# Patient Record
Sex: Male | Born: 1955 | Race: White | Hispanic: No | Marital: Single | Smoking: Current every day smoker
Health system: Southern US, Community
[De-identification: ages and names within clinical notes are randomized; demographics above are authoritative.]

## PROBLEM LIST (undated history)

## (undated) HISTORY — PX: EYE SURGERY: SHX253

## (undated) HISTORY — PX: HERNIA REPAIR: SHX51

---

## 2014-01-09 ENCOUNTER — Emergency Department (HOSPITAL_COMMUNITY): Payer: Self-pay

## 2014-01-09 ENCOUNTER — Encounter (HOSPITAL_COMMUNITY): Payer: Self-pay | Admitting: Emergency Medicine

## 2014-01-09 ENCOUNTER — Emergency Department (HOSPITAL_COMMUNITY)
Admission: EM | Admit: 2014-01-09 | Discharge: 2014-01-10 | Disposition: A | Discharge status: Home / Self Care | Payer: Self-pay | Attending: Emergency Medicine | Admitting: Emergency Medicine

## 2014-01-09 DIAGNOSIS — K222 Esophageal obstruction: Secondary | ICD-10-CM | POA: Insufficient documentation

## 2014-01-09 DIAGNOSIS — T18128A Food in esophagus causing other injury, initial encounter: Secondary | ICD-10-CM | POA: Insufficient documentation

## 2014-01-09 DIAGNOSIS — T18108A Unspecified foreign body in esophagus causing other injury, initial encounter: Secondary | ICD-10-CM

## 2014-01-09 DIAGNOSIS — F17200 Nicotine dependence, unspecified, uncomplicated: Secondary | ICD-10-CM | POA: Insufficient documentation

## 2014-01-09 DIAGNOSIS — N289 Disorder of kidney and ureter, unspecified: Secondary | ICD-10-CM

## 2014-01-09 DIAGNOSIS — K2210 Ulcer of esophagus without bleeding: Secondary | ICD-10-CM | POA: Insufficient documentation

## 2014-01-09 DIAGNOSIS — E860 Dehydration: Secondary | ICD-10-CM

## 2014-01-09 DIAGNOSIS — Z7982 Long term (current) use of aspirin: Secondary | ICD-10-CM | POA: Insufficient documentation

## 2014-01-09 DIAGNOSIS — T189XXA Foreign body of alimentary tract, part unspecified, initial encounter: Secondary | ICD-10-CM

## 2014-01-09 DIAGNOSIS — R739 Hyperglycemia, unspecified: Secondary | ICD-10-CM

## 2014-01-09 NOTE — ED Notes (Signed)
Pt. reported that a piece of meat lodged in his lower esophagus while eating pot roast last Sunday , airway intact / respirations unlabored .

## 2014-01-10 ENCOUNTER — Encounter (HOSPITAL_COMMUNITY)
Admission: EM | Disposition: A | Discharge status: Home / Self Care | Payer: Self-pay | Source: Home / Self Care | Attending: Emergency Medicine

## 2014-01-10 ENCOUNTER — Encounter (HOSPITAL_COMMUNITY): Payer: Self-pay

## 2014-01-10 DIAGNOSIS — K222 Esophageal obstruction: Secondary | ICD-10-CM

## 2014-01-10 DIAGNOSIS — T18108A Unspecified foreign body in esophagus causing other injury, initial encounter: Secondary | ICD-10-CM

## 2014-01-10 HISTORY — PX: ESOPHAGOGASTRODUODENOSCOPY: SHX5428

## 2014-01-10 LAB — BASIC METABOLIC PANEL
ANION GAP: 16 — AB (ref 5–15)
BUN: 38 mg/dL — ABNORMAL HIGH (ref 6–23)
CALCIUM: 9.5 mg/dL (ref 8.4–10.5)
CO2: 21 mmol/L (ref 19–32)
CREATININE: 1.48 mg/dL — AB (ref 0.50–1.35)
Chloride: 107 mEq/L (ref 96–112)
GFR calc non Af Amer: 50 mL/min — ABNORMAL LOW (ref >90)
GFR, EST AFRICAN AMERICAN: 58 mL/min — AB (ref >90)
Glucose, Bld: 155 mg/dL — ABNORMAL HIGH (ref 70–99)
POTASSIUM: 4.5 mmol/L (ref 3.5–5.1)
Sodium: 144 mmol/L (ref 135–145)

## 2014-01-10 LAB — CBC WITH DIFFERENTIAL/PLATELET
BASOS ABS: 0 10*3/uL (ref 0.0–0.1)
Basophils Relative: 0 % (ref 0–1)
EOS PCT: 0 % (ref 0–5)
Eosinophils Absolute: 0 10*3/uL (ref 0.0–0.7)
HEMATOCRIT: 51 % (ref 39.0–52.0)
HEMOGLOBIN: 18.1 g/dL — AB (ref 13.0–17.0)
Lymphocytes Relative: 17 % (ref 12–46)
Lymphs Abs: 2.3 10*3/uL (ref 0.7–4.0)
MCH: 33 pg (ref 26.0–34.0)
MCHC: 35.5 g/dL (ref 30.0–36.0)
MCV: 92.9 fL (ref 78.0–100.0)
MONO ABS: 0.7 10*3/uL (ref 0.1–1.0)
MONOS PCT: 5 % (ref 3–12)
NEUTROS ABS: 10.9 10*3/uL — AB (ref 1.7–7.7)
Neutrophils Relative %: 78 % — ABNORMAL HIGH (ref 43–77)
Platelets: 270 10*3/uL (ref 150–400)
RBC: 5.49 MIL/uL (ref 4.22–5.81)
RDW: 13 % (ref 11.5–15.5)
WBC: 13.9 10*3/uL — AB (ref 4.0–10.5)

## 2014-01-10 SURGERY — EGD (ESOPHAGOGASTRODUODENOSCOPY)
Anesthesia: Moderate Sedation

## 2014-01-10 MED ORDER — BUTAMBEN-TETRACAINE-BENZOCAINE 2-2-14 % EX AERO
INHALATION_SPRAY | CUTANEOUS | Status: DC | PRN
  Administered 2014-01-10: 2 via TOPICAL

## 2014-01-10 MED ORDER — SODIUM CHLORIDE 0.9 % IV SOLN
1000.0000 mL | Freq: Once | INTRAVENOUS | Status: AC
  Administered 2014-01-10: 1000 mL via INTRAVENOUS

## 2014-01-10 MED ORDER — FENTANYL CITRATE 0.05 MG/ML IJ SOLN
INTRAMUSCULAR | Status: AC
  Filled 2014-01-10: qty 2

## 2014-01-10 MED ORDER — MIDAZOLAM HCL 5 MG/ML IJ SOLN
INTRAMUSCULAR | Status: AC
  Filled 2014-01-10: qty 2

## 2014-01-10 MED ORDER — SODIUM CHLORIDE 0.9 % IV SOLN
1000.0000 mL | INTRAVENOUS | Status: DC
  Administered 2014-01-10: 1000 mL via INTRAVENOUS

## 2014-01-10 MED ORDER — GLUCAGON HCL RDNA (DIAGNOSTIC) 1 MG IJ SOLR
1.0000 mg | Freq: Once | INTRAMUSCULAR | Status: AC
  Administered 2014-01-10: 1 mg via INTRAVENOUS
  Filled 2014-01-10: qty 1

## 2014-01-10 MED ORDER — FENTANYL CITRATE 0.05 MG/ML IJ SOLN
INTRAMUSCULAR | Status: DC | PRN
  Administered 2014-01-10 (×3): 25 ug via INTRAVENOUS

## 2014-01-10 MED ORDER — PANTOPRAZOLE SODIUM 40 MG PO TBEC: 40.0000 mg | DELAYED_RELEASE_TABLET | Freq: Every day | ORAL | Status: AC

## 2014-01-10 MED ORDER — MIDAZOLAM HCL 5 MG/5ML IJ SOLN
INTRAMUSCULAR | Status: DC | PRN
  Administered 2014-01-10 (×3): 2 mg via INTRAVENOUS

## 2014-01-10 NOTE — ED Notes (Signed)
Pt sts that he feels like something is stuck in his throat.  Sts the sensation has progressed down into his abdomen and that it feels "tight".  Sts he has tried drinking water, warm water, diet coke with no relief; sts everything comes back up.  Airway intact.

## 2014-01-10 NOTE — ED Provider Notes (Signed)
CSN: [id number]     Arrival date & time 01/09/14  2324 History  This chart was scribed for Dione Boozeavid Benjie Ricketson, MD by Richarda Overlieichard Holland, ED Scribe. This patient was seen in room D34C/D34C and the patient's care was started 1:13 AM.    Chief Complaint  Patient presents with  . Foreign Body   The history is provided by the patient. No language interpreter was used.   HPI Comments: Craig Crawford is a 59 y.o. male who presents to the Emergency Department complaining of a foreign body in his throat that started 3 days ago. Pt reports that a piece of meat lodged in his lower esophagus while he eating pot roast. He states that he feels like something is stuck in his throat and the sensation has since progressed down into his abdomen. Pt describes the sensation as "tight". He says he has tried drinking water, olive oil, coconut oil and diet coke and the olive oil did feel like it worked the foreign body down. Pt states that when he urinated today his urine was very yellow and his mouth is very dry. He states he has not been able to eat or drink anything since the incident. He denies a history of GERD. Pt reports NKDA.    History reviewed. No pertinent past medical history. History reviewed. No pertinent past surgical history. No family history on file. History  Substance Use Topics  . Smoking status: Current Every Day Smoker  . Smokeless tobacco: Not on file  . Alcohol Use: No    Review of Systems  HENT:       Foreign body sensation in throat  All other systems reviewed and are negative.  Allergies  Review of patient's allergies indicates no known allergies.  Home Medications   Prior to Admission medications   Medication Sig Start Date End Date Taking? Authorizing Provider  aspirin EC 325 MG tablet Take 325 mg by mouth daily.   Yes Historical Provider, MD  Multiple Vitamin (MULTIVITAMIN WITH MINERALS) TABS tablet Take 1 tablet by mouth daily.   Yes Historical Provider, MD   BP 121/83 mmHg  Pulse  89  Temp(Src) 97.6 F (36.4 C) (Oral)  Resp 20  Ht 6' (1.829 m)  Wt 148 lb (67.132 kg)  BMI 20.07 kg/m2  SpO2 98% Physical Exam  Constitutional: He is oriented to person, place, and time. He appears well-developed and well-nourished.  HENT:  Head: Normocephalic and atraumatic.  Eyes: Pupils are equal, round, and reactive to light. Right eye exhibits no discharge. Left eye exhibits no discharge.  Neck: Normal range of motion. Neck supple. No JVD present.  Cardiovascular: Normal rate, regular rhythm and normal heart sounds.   No murmur heard. Pulmonary/Chest: Effort normal and breath sounds normal. No respiratory distress. He has no wheezes. He has no rales.  Abdominal: Soft. Bowel sounds are normal. He exhibits no distension and no mass. There is no tenderness.  Musculoskeletal: Normal range of motion. He exhibits no edema.  Lymphadenopathy:    He has no cervical adenopathy.  Neurological: He is alert and oriented to person, place, and time. He has normal reflexes. No cranial nerve deficit. Coordination normal.  Skin: Skin is warm and dry. No rash noted.  Psychiatric: He has a normal mood and affect. His behavior is normal. Thought content normal.  Nursing note and vitals reviewed.   ED Course  Procedures   DIAGNOSTIC STUDIES: Oxygen Saturation is 98% on RA, normal by my interpretation.    COORDINATION OF CARE:  1:18 AM Discussed treatment plan with pt at bedside and pt agreed to plan.   Labs Review Results for orders placed or performed during the hospital encounter of 01/09/14  CBC with Differential  Result Value Ref Range   WBC 13.9 (H) 4.0 - 10.5 K/uL   RBC 5.49 4.22 - 5.81 MIL/uL   Hemoglobin 18.1 (H) 13.0 - 17.0 g/dL   HCT 16.1 09.6 - 04.5 %   MCV 92.9 78.0 - 100.0 fL   MCH 33.0 26.0 - 34.0 pg   MCHC 35.5 30.0 - 36.0 g/dL   RDW 40.9 81.1 - 91.4 %   Platelets 270 150 - 400 K/uL   Neutrophils Relative % 78 (H) 43 - 77 %   Neutro Abs 10.9 (H) 1.7 - 7.7 K/uL    Lymphocytes Relative 17 12 - 46 %   Lymphs Abs 2.3 0.7 - 4.0 K/uL   Monocytes Relative 5 3 - 12 %   Monocytes Absolute 0.7 0.1 - 1.0 K/uL   Eosinophils Relative 0 0 - 5 %   Eosinophils Absolute 0.0 0.0 - 0.7 K/uL   Basophils Relative 0 0 - 1 %   Basophils Absolute 0.0 0.0 - 0.1 K/uL  Basic metabolic panel  Result Value Ref Range   Sodium 144 135 - 145 mmol/L   Potassium 4.5 3.5 - 5.1 mmol/L   Chloride 107 96 - 112 mEq/L   CO2 21 19 - 32 mmol/L   Glucose, Bld 155 (H) 70 - 99 mg/dL   BUN 38 (H) 6 - 23 mg/dL   Creatinine, Ser 7.82 (H) 0.50 - 1.35 mg/dL   Calcium 9.5 8.4 - 95.6 mg/dL   GFR calc non Af Amer 50 (L) >90 mL/min   GFR calc Af Amer 58 (L) >90 mL/min   Anion gap 16 (H) 5 - 15    Imaging Review Dg Abd 1 View  01/10/2014   CLINICAL DATA:  Lower abdominal pain and constipation for 3 days. Patient states having food lodged and unable to pass food.  EXAM: ABDOMEN - 1 VIEW  COMPARISON:  None.  FINDINGS: Scattered gas and stool in the colon. No small or large bowel distention. No radiopaque stones. Visualized bones appear intact. Degenerative changes in the lumbar spine and hips.  IMPRESSION: Nonobstructive bowel gas pattern.   Electronically Signed   By: Burman Nieves M.D.   On: 01/10/2014 00:01     MDM   Final diagnoses:  Dehydration  Renal insufficiency  Hyperglycemia    Esophageal meat impaction that has been present for 2 days. Probable dehydration with inability to take fluids during that two day period. He will be given IV fluids and he had trilobar 5 glucagon. However, given the length of time meat impaction is present, I doubt it will be successful.  Patient had no relief following glucagon. Laboratory workup shows evidence of dehydration with mild elevation of creatinine. This will need to be rechecked after he has completed hydration there is also noted to have hyperglycemia. This will need to be followed as an outpatient.  Case is discussed with Dr. Arlyce Dice who is  on-call for gastroenterology who is requested that the patient kept in the ED for endoscopy in the morning.  I personally performed the services described in this documentation, which was scribed in my presence. The recorded information has been reviewed and is accurate.       Dione Booze, MD 01/10/14 [phone]

## 2014-01-10 NOTE — ED Notes (Signed)
Pt reports no relief with Glucagon.

## 2014-01-10 NOTE — ED Notes (Signed)
Received phone call from Autumn from Endoscopy, stated she was sending transportation down to take patient to Endo.

## 2014-01-10 NOTE — Consult Note (Signed)
.          GI Consultation  Referring Provider: No ref. provider found Primary Care Physician:  No primary care provider on file. Primary Gastroenterologist:  Dr.  Jaquita Rectoreason for Consultation:  *Food impaction**  HPI: Craig Crawford is a 59 y.o. male *referred from the emergency room because of inability to swallow liquids and solids.  This occured suddenly 3 days ago*.  A piece of meat large in his esophagus and he has been unable to swallow liquids and solids.  He complains of minor upper abdominal discomfort.  Heretofore he is has had occasional pyrosis.  He is also had what sounds like food impactions which have resolved spontaneously.*   History reviewed. No pertinent past medical history.  Past Surgical History  Procedure Laterality Date  . Eye surgery      cataract surgery on left eye  . Hernia repair      Prior to Admission medications   Medication Sig Start Date End Date Taking? Authorizing Provider  aspirin EC 325 MG tablet Take 325 mg by mouth daily.   Yes Historical Provider, MD  Multiple Vitamin (MULTIVITAMIN WITH MINERALS) TABS tablet Take 1 tablet by mouth daily.   Yes Historical Provider, MD    Current Facility-Administered Medications  Medication Dose Route Frequency Provider Last Rate Last Dose  . 0.9 %  sodium chloride infusion  1,000 mL Intravenous Continuous Dione Boozeavid Glick, MD   Stopped at 01/10/14 [phone]    Allergies as of 01/09/2014  . (No Known Allergies)    History reviewed. No pertinent family history.  History   Social History  . Marital Status: Single    Spouse Name: N/A    Number of Children: N/A  . Years of Education: N/A   Occupational History  . Not on file.   Social History Main Topics  . Smoking status: Current Every Day Smoker  . Smokeless tobacco: Not on file  . Alcohol Use: No  . Drug Use: No  . Sexual Activity: Not on file   Other Topics Concern  . Not on file   Social History Narrative    Review of Systems: Pertinent  positive and negative review of systems were noted in the above history of present illness section. All other review of systems were otherwise negative   Physical Exam: Vital signs in last 24 hours: Temp:  [97.6 F (36.4 C)-98 F (36.7 C)] 98 F (36.7 C) (01/06 0712) Pulse Rate:  [68-94] 72 (01/06 0737) Resp:  [16-22] 22 (01/06 0737) BP: (118-138)/(65-87) 118/65 mmHg (01/06 0737) SpO2:  [94 %-100 %] 97 % (01/06 0737) Weight:  [148 lb (67.132 kg)] 148 lb (67.132 kg) (01/05 2330)  General:   Alert,  Well-developed, well-nourished, pleasant and cooperative in NAD Head:  Normocephalic and atraumatic. Eyes:  Sclera clear, no icterus.   Conjunctiva pink. Ears:  Normal auditory acuity. Nose:  No deformity, discharge,  or lesions. Mouth:  No deformity or lesions.  Oropharynx pink & moist. Neck:  Supple; no masses or thyromegaly. Lungs:  Clear throughout to auscultation.   No wheezes, crackles, or rhonchi. No acute distress. Heart:  Regular rate and rhythm; no murmurs, clicks, rubs,  or gallops. Abdomen:  Soft, nontender and nondistended. No masses, hepatosplenomegaly or hernias noted. Normal bowel sounds, without guarding, and without rebound.   Rectal:  Deferred until time of colonoscopy.   Msk:  Symmetrical without gross deformities. Normal posture. Pulses:  Normal pulses noted. Extremities:  Without clubbing or edema. Neurologic:  Alert  and  oriented x4;  grossly normal neurologically. Skin:  Intact without significant lesions or rashes. Cervical Nodes:  No significant cervical adenopathy. Psych:  Alert and cooperative. Normal mood and affect.  Intake/Output from previous day:   Intake/Output this shift:    Lab Results:  Recent Labs  01/10/14 0115  WBC 13.9*  HGB 18.1*  HCT 51.0  PLT 270   BMET  Recent Labs  01/10/14 0115  NA 144  K 4.5  CL 107  CO2 21  GLUCOSE 155*  BUN 38*  CREATININE 1.48*  CALCIUM 9.5   LFT No results for input(s): PROT, ALBUMIN, AST,  ALT, ALKPHOS, BILITOT, BILIDIR, IBILI in the last 72 hours. PT/INR No results for input(s): LABPROT, INR in the last 72 hours. Hepatitis Panel No results for input(s): HEPBSAG, HCVAB, HEPAIGM, HEPBIGM in the last 72 hours. Additional Labs **  Studies/Results: Dg Abd 1 View  01/10/2014   CLINICAL DATA:  Lower abdominal pain and constipation for 3 days. Patient states having food lodged and unable to pass food.  EXAM: ABDOMEN - 1 VIEW  COMPARISON:  None.  FINDINGS: Scattered gas and stool in the colon. No small or large bowel distention. No radiopaque stones. Visualized bones appear intact. Degenerative changes in the lumbar spine and hips.  IMPRESSION: Nonobstructive bowel gas pattern.   Electronically Signed   By: Burman Nieves M.D.   On: 01/10/2014 00:01    Active Problems:   * No active hospital problems. *     IMPRESSION:   *Food impaction of the esophagus.  Suspect esophageal stricture**    PLAN:     **Endoscopy  Risks, alternatives, and complications of the procedure, including bleeding, perforation, and possible need for surgery, were explained to the patient.  Patient's questions were answered. Barbette Hair. Arlyce Dice, MD, [facility] [phone]    01/10/2014, 7:39 AM

## 2014-01-10 NOTE — Op Note (Signed)
[facility] [address] [place], [id number]   ENDOSCOPY PROCEDURE REPORT  PATIENT: Craig Crawford, Craig Crawford  MR#: [id number] BIRTHDATE: April 22, 1955 , 58  yrs. old GENDER: male ENDOSCOPIST: Louis Meckelobert D Kaplan, MD REFERRED BY: PROCEDURE DATE:  01/10/2014 PROCEDURE:  EGD, diagnostic ASA CLASS:     Class I INDICATIONS:  foreign body removal. MEDICATIONS: Versed 6 mg IV and Fentanyl 75 mcg IV TOPICAL ANESTHETIC: Cetacaine Spray  DESCRIPTION OF PROCEDURE: After the risks benefits and alternatives of the procedure were thoroughly explained, informed consent was obtained.  The PENTAX GASTOROSCOPE [id number] endoscope was introduced through the mouth and advanced to the second portion of the duodenum , Without limitations.  The instrument was slowly withdrawn as the mucosa was fully examined.    In the distal esophagus approximate 2 cm proximal to the GE junction there was a peptic appearing stricture.  There was marked erythema and multiple ulcers in this area secondary to a food impaction that spontaneously passed.  The stricture was easily traversable with the 9 mm gastroscope.   Otherwise normal EGD  Retroflexed views revealed no abnormalities.     The scope was then withdrawn from the patient and the procedure completed.  COMPLICATIONS: There were no immediate complications.  ENDOSCOPIC IMPRESSION: 1.   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   Otherwise normal EGD  RECOMMENDATIONS: 1.  begin Protonix 40 mg daily 2.  EGD with balloon dilation in 2-3 weeks 3.  Soft diet  REPEAT EXAM:  eSigned:  Louis Meckelobert D Kaplan, MD 01/10/2014 8:05 AM    CC:

## 2014-01-10 NOTE — ED Notes (Signed)
Sam from Endoscopy here to retrieve patient.

## 2014-01-10 NOTE — Discharge Instructions (Signed)
Esophagogastroduodenoscopy °Care After °Refer to this sheet in the next few weeks. These instructions provide you with information on caring for yourself after your procedure. Your caregiver may also give you more specific instructions. Your treatment has been planned according to current medical practices, but problems sometimes occur. Call your caregiver if you have any problems or questions after your procedure.  °HOME CARE INSTRUCTIONS °· Do not eat or drink anything until the numbing medicine (local anesthetic) has worn off and your gag reflex has returned. You will know that the local anesthetic has worn off when you can swallow comfortably. °· Do not drive for 12 hours after the procedure or as directed by your caregiver. °· Only take medicines as directed by your caregiver. °SEEK MEDICAL CARE IF:  °· You cannot stop coughing. °· You are not urinating at all or less than usual. °SEEK IMMEDIATE MEDICAL CARE IF: °· You have difficulty swallowing. °· You cannot eat or drink. °· You have worsening throat or chest pain. °· You have dizziness, lightheadedness, or you faint. °· You have nausea or vomiting. °· You have chills. °· You have a fever. °· You have severe abdominal pain. °· You have black, tarry, or bloody stools. °Document Released: 12/09/2011 Document Reviewed: 12/09/2011 °Exit[employer]® Patient Information ©2015 [employer]. This information is not intended to replace advice given to you by your health care provider. Make sure you discuss any questions you have with your health care provider. ° °

## 2014-01-11 ENCOUNTER — Encounter (HOSPITAL_COMMUNITY): Payer: Self-pay | Admitting: Gastroenterology

## 2014-01-12 ENCOUNTER — Telehealth: Payer: Self-pay | Admitting: *Deleted

## 2014-01-12 ENCOUNTER — Other Ambulatory Visit: Payer: Self-pay | Admitting: *Deleted

## 2014-01-12 DIAGNOSIS — K222 Esophageal obstruction: Secondary | ICD-10-CM

## 2014-01-12 DIAGNOSIS — R1310 Dysphagia, unspecified: Secondary | ICD-10-CM

## 2014-01-12 NOTE — Telephone Encounter (Signed)
L/M for him to contact the office

## 2014-01-17 NOTE — Telephone Encounter (Signed)
2nd message left for patient about appt date and time of his EGD in Feb

## 2014-01-23 ENCOUNTER — Telehealth: Payer: Self-pay | Admitting: Gastroenterology

## 2014-01-23 NOTE — Telephone Encounter (Signed)
Spoke with the patient. He is uninsured and will be applying for the financial assistance program through the hospital. He may have to cancel his EGD with dilation until he gets his paperwork done. He will let me know. This is an Financial plannerYI. He is scheduled for 02/13/14.

## 2014-01-23 NOTE — Telephone Encounter (Signed)
Procedure cancelled. Patient will call when he can reschedule.

## 2014-01-23 NOTE — Telephone Encounter (Signed)
ok 

## 2014-02-13 ENCOUNTER — Ambulatory Visit (HOSPITAL_COMMUNITY): Admission: RE | Admit: 2014-02-13 | Payer: Self-pay | Source: Ambulatory Visit | Admitting: Gastroenterology

## 2014-02-13 ENCOUNTER — Encounter (HOSPITAL_COMMUNITY): Admission: RE | Payer: Self-pay | Source: Ambulatory Visit

## 2014-02-13 SURGERY — ESOPHAGOGASTRODUODENOSCOPY (EGD) WITH PROPOFOL
Anesthesia: Monitor Anesthesia Care

## 2015-12-06 IMAGING — CR DG ABDOMEN 1V
1 series · 1 of 1 positions shown · non-contrast
Comparison: None.

CLINICAL DATA: Lower abdominal pain and constipation for 3 days.
Patient states having food lodged and unable to pass food.

EXAM:
ABDOMEN - 1 VIEW

[abdomen supine]
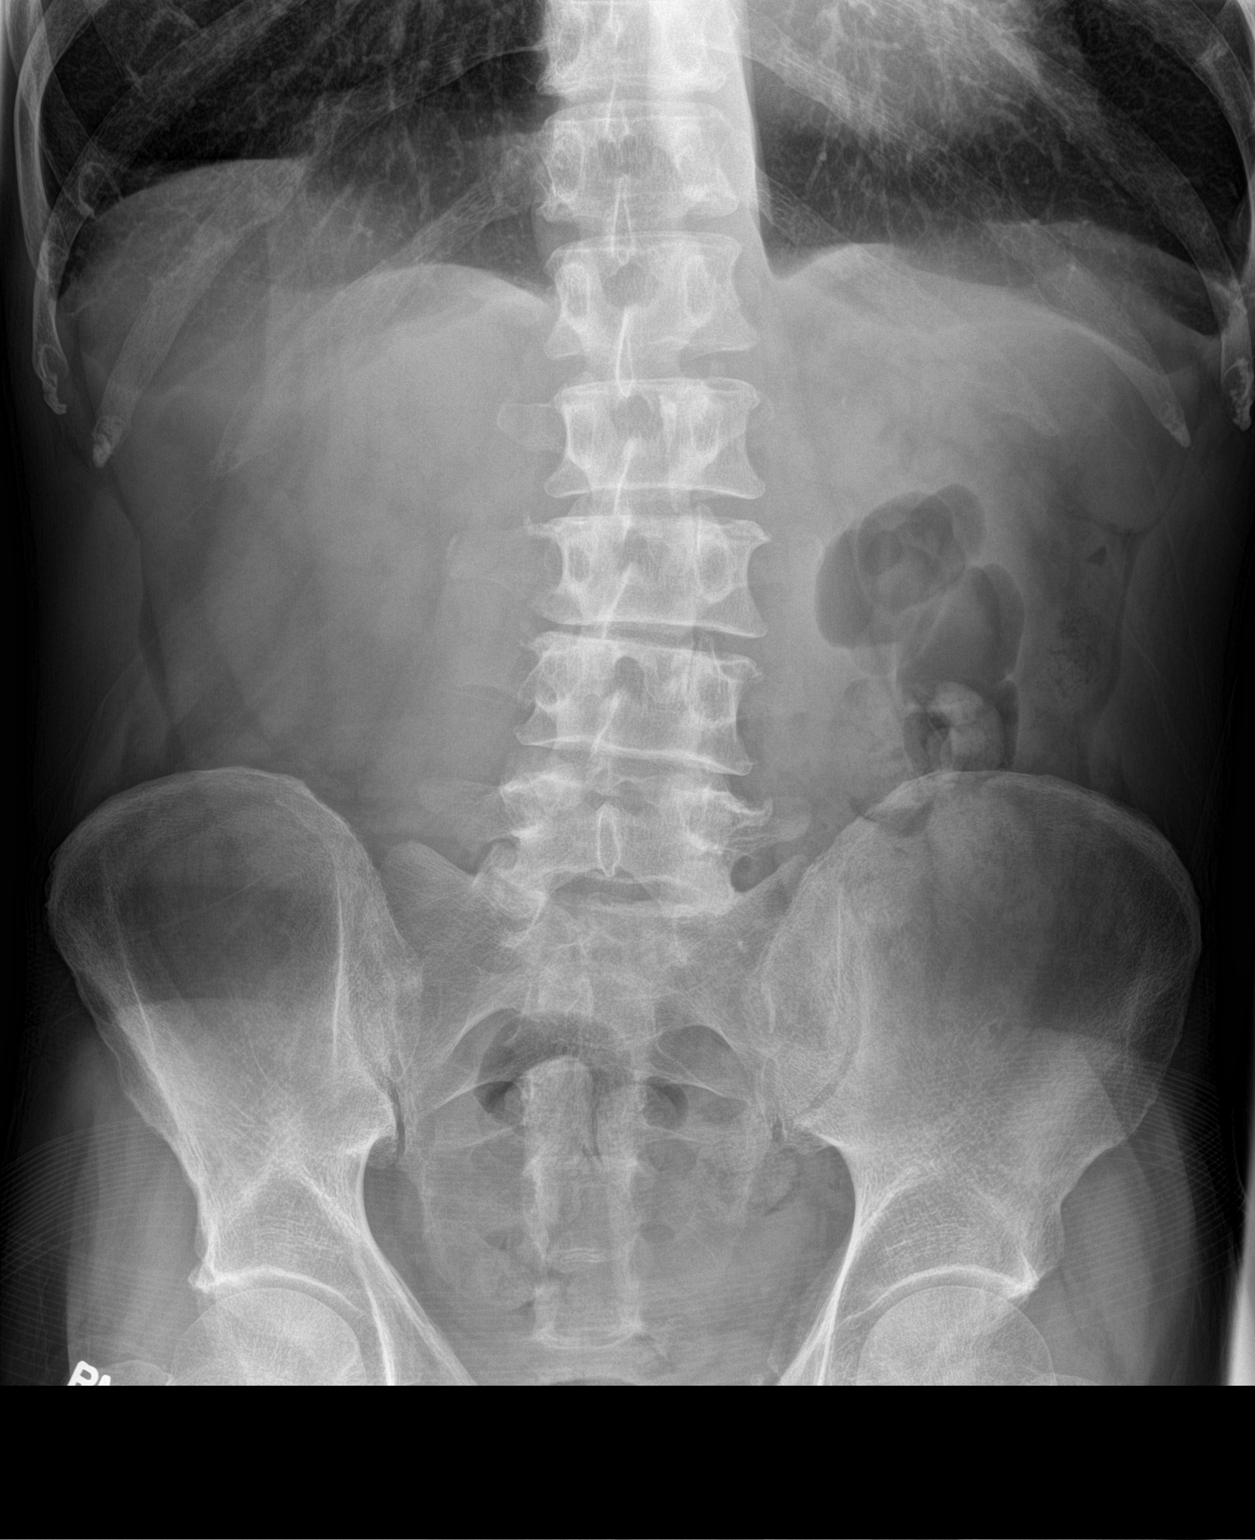

[1 of 1 positions shown; findings below may reference images not displayed]

FINDINGS: Scattered gas and stool in the colon. No small or large bowel
distention. No radiopaque stones. Visualized bones appear intact.
Degenerative changes in the lumbar spine and hips.
IMPRESSION: Nonobstructive bowel gas pattern.
